# Patient Record
Sex: Female | Born: 1950
Health system: Southern US, Community
[De-identification: ages and names within clinical notes are randomized; demographics above are authoritative.]

## PROBLEM LIST (undated history)

## (undated) DIAGNOSIS — E119 Type 2 diabetes mellitus without complications: Secondary | ICD-10-CM

## (undated) DIAGNOSIS — F4310 Post-traumatic stress disorder, unspecified: Secondary | ICD-10-CM

## (undated) DIAGNOSIS — I10 Essential (primary) hypertension: Secondary | ICD-10-CM

## (undated) DIAGNOSIS — E7800 Pure hypercholesterolemia, unspecified: Secondary | ICD-10-CM

## (undated) HISTORY — PX: APPENDECTOMY: SHX54

## (undated) HISTORY — PX: ADENOIDECTOMY: SUR15

## (undated) HISTORY — PX: BREAST LUMPECTOMY: SHX2

## (undated) HISTORY — PX: TONSILLECTOMY: SUR1361

---

## 2011-01-18 ENCOUNTER — Emergency Department (HOSPITAL_COMMUNITY)
Admission: EM | Admit: 2011-01-18 | Discharge: 2011-01-20 | Disposition: A | Discharge status: Other Acute Inpatient Hospital | Payer: Self-pay | Source: Home / Self Care | Admitting: Emergency Medicine

## 2011-01-20 ENCOUNTER — Inpatient Hospital Stay (HOSPITAL_COMMUNITY)
Admission: AD | Admit: 2011-01-20 | Discharge: 2011-02-15 | DRG: 885 | Disposition: A | Discharge status: Home / Self Care | Payer: 59 | Attending: Psychiatry | Admitting: Psychiatry

## 2011-01-20 DIAGNOSIS — F4310 Post-traumatic stress disorder, unspecified: Secondary | ICD-10-CM

## 2011-01-20 DIAGNOSIS — Z111 Encounter for screening for respiratory tuberculosis: Secondary | ICD-10-CM

## 2011-01-20 DIAGNOSIS — F22 Delusional disorders: Secondary | ICD-10-CM

## 2011-01-20 DIAGNOSIS — I252 Old myocardial infarction: Secondary | ICD-10-CM

## 2011-01-20 DIAGNOSIS — F29 Unspecified psychosis not due to a substance or known physiological condition: Principal | ICD-10-CM

## 2011-01-20 DIAGNOSIS — Z560 Unemployment, unspecified: Secondary | ICD-10-CM

## 2011-01-20 DIAGNOSIS — Z818 Family history of other mental and behavioral disorders: Secondary | ICD-10-CM

## 2011-01-22 LAB — BASIC METABOLIC PANEL
BUN: 10 mg/dL (ref 6–23)
CO2: 24 mEq/L (ref 19–32)
Chloride: 104 mEq/L (ref 96–112)
Creatinine, Ser: 0.88 mg/dL (ref 0.4–1.2)
Glucose, Bld: 142 mg/dL — ABNORMAL HIGH (ref 70–99)

## 2011-01-22 LAB — CBC
MCV: 88 fL (ref 78.0–100.0)
Platelets: 254 10*3/uL (ref 150–400)
RBC: 4.43 MIL/uL (ref 3.87–5.11)
WBC: 8.9 10*3/uL (ref 4.0–10.5)

## 2011-01-22 LAB — RAPID URINE DRUG SCREEN, HOSP PERFORMED
Barbiturates: NOT DETECTED
Benzodiazepines: NOT DETECTED
Cocaine: NOT DETECTED
Opiates: NOT DETECTED

## 2011-01-22 LAB — DIFFERENTIAL
Basophils Absolute: 0 10*3/uL (ref 0.0–0.1)
Eosinophils Absolute: 0 10*3/uL (ref 0.0–0.7)
Lymphocytes Relative: 23 % (ref 12–46)
Lymphs Abs: 2 10*3/uL (ref 0.7–4.0)
Neutrophils Relative %: 69 % (ref 43–77)

## 2011-01-22 LAB — ETHANOL: Alcohol, Ethyl (B): <5 mg/dL (ref 0–10)

## 2011-01-29 NOTE — H&P (Signed)
NAMESHUNDRA, WIRSING NO.:  [id number]  MEDICAL RECORD NO.:  [id number]          PATIENT TYPE:  IPS  LOCATION:  0407                          FACILITY:  [facility]  PHYSICIAN:  Anselm Jungling, MD  DATE OF BIRTH:  April 14, 1951  DATE OF ADMISSION:  01/20/2011 DATE OF DISCHARGE:                      PSYCHIATRIC ADMISSION ASSESSMENT   This is an involuntary admission to the services of Dr. Geralyn Flash. This is a 60 year old divorced white female.  The commitment papers indicate that the patient is known to be mentally ill.  She is refusing to take her meds.  She is seeing people come out of the walls.  She thinks people are trying to kill her.  Apparently she texted her daughter that 5 evil people are in the house trying to kill her.  She states that she is going to fight to the death and/or kill them.  Her daughter called 911 and the police came and the deputy said there was nobody at the house but the respondent.  The respondent was noted to have poor hygiene.  She was not eating or drinking which was very uncommon for her.  The respondent was felt to be a danger to herself and others and hence was brought to [facility] where she had medical clearance.  Today she is quite hyperverbal.  She is also somewhat circumstantial.  However, she does in fact reinforce the fact that there were 5 evil people in the house, that she invoked the name of Jesus to have them disappear and they did.  She also reiterates that she is in charge of an estate in [place], that it has all been turned over to [place], that there no money involved, that her new son-in-law and his family are trying to take it but nothing about this can happen and somehow her father is the last of royalty in [place] and is somehow related to [place] from [place].  She does report that back in 2004 she suffered PTSD.  She was under the care of a Dr. Merita Norton at that time.  She also reports that last  year she had an MI and was hospitalized at [facility] in [place], [name].  She is not currently on any meds and she states that meds generally do not work on her like they do on other people.  SOCIAL HISTORY:  She goes to school a lot.  She has been married once. She has two daughters ages 22 and 24.  She is currently here visiting the younger daughter and her granddaughter.  FAMILY HISTORY:  Apparently her mother was a paranoid schizophrenic.  ALCOHOL AND DRUG HISTORY:  She denies.  MEDICAL CARE PROVIDER:  Is back in [name].  She does not have any here.  She has not been under psychiatric care recently.  She states her provider died a year or two ago.  MEDICAL PROBLEMS:  She reports having had an MI last year.  MEDICATIONS:  None.  DRUG ALLERGIES:  None.  POSITIVE PHYSICAL FINDINGS:  She was medically cleared in the ED at [facility].  She was afebrile.  Her temperature ranged from 97  to 98.5. Her pulse from 61 to 98.  Her respirations from 16 to 20 and her blood pressure from 110/49 to 153/79.  Her UDS was negative.  She had no abnormalities of CBC.  She had no alcohol.  Her potassium was slightly low at 3.3 and her glucose was slightly elevated at 142.  MENTAL STATUS EXAM:  Today she is alert and oriented.  She has taken a shower.  She is appropriately groomed and dressed in her own clothing. She appears to be adequately nourished.  There are notes from the family that indicate a 60 to 80 pound weight loss, however, that is not confirmed at least by the clothing she is wearing and visual inspection. Her speech is hyperverbal.  She can be circumstantial and tangential. Her mood is anxiously depressed.  Her thought processes are somewhat clear, rational and goal oriented.  She is wanting discharge.  She says that this is inappropriate.  She is being held here against her will. Judgment and insight are poor.  Concentration and memory are at least superficially  intact.  Intelligence is at least average.  She denies being suicidal or homicidal.  She denies auditory or visual hallucinations.  However, she does of her own free will tell about the 5 people at the house and how she invoked the name of Jesus to have them leave so she is maintaining her delusions.  DIAGNOSES:  AXIS I:  History for PTSD (post traumatic stress disorder). She is currently delusional. AXIS II:  Deferred. AXIS III:  She reports a myocardial infarction last year. AXIS IV:  Severe, unemployed. AXIS V:  30.  She was empirically ordered Zyprexa 5 mg at h.s. last night.  She states she did not take it.  She is also requesting a different room as she never sleeps near a door and the other bed in her room is empty but she wants to change rooms.  We will try and get her old records and try to come up with some form of plan.  She states that she is ready to go back to [name].     Mickie Leonarda Salon, P.A.-C.   ______________________________ Anselm Jungling, MD    MD/MEDQ  D:  01/21/2011  T:  01/21/2011  Job:  [id number]  Electronically Signed by Jaci Lazier ADAMS P.A.-C. on 01/28/2011 01:59:20 PM Electronically Signed by Geralyn Flash MD on 01/29/2011 08:42:49 AM

## 2011-01-31 DIAGNOSIS — F22 Delusional disorders: Secondary | ICD-10-CM

## 2011-02-22 NOTE — Discharge Summary (Signed)
NAMETAMBI, THOLE NO.:  [id number]  MEDICAL RECORD NO.:  [id number]           PATIENT TYPE:  I  LOCATION:  0407                          FACILITY:  BH  PHYSICIAN:  Anselm Jungling, MD  DATE OF BIRTH:  02-Jan-1951  DATE OF ADMISSION:  01/20/2011 DATE OF DISCHARGE:  02/15/2011                              DISCHARGE SUMMARY   IDENTIFYING DATA/REASON FOR ADMISSION:  This was the first [name] admission for Chelsey Long, a 60 year old divorced Caucasian female who was admitted for treatment of psychotic disorder.  Please refer to the admission note for further details pertaining to the symptoms, circumstances and history that led to her hospitalization.  She was given an initial Axis I diagnosis of psychosis NOS.  MEDICAL AND LABORATORY:  The patient was medically seen and examined by the psychiatric nurse practitioner.  She was in good health without any active or chronic medical problems.  There were no significant medical issues during her stay.  HOSPITAL COURSE:  The patient was admitted to the adult inpatient psychiatric service.  She presented as a well-nourished, normally- developed adult woman of above-average intelligence and education.  She had been brought, under involuntary commitment, at the encouragement of her family.  She had essentially been living out of her car for the previous year.  She had been spending time in the state of [name]. She came to [name], to be closer to her daughter, who was married with several children.  It was the daughter, her son-in-law, who identified Chelsey Long as having significant mental illness in need of immediate treatment.  She presented with florid delusions pertaining to [name], and appeared to be unable to care for herself any further in her itinerant situation of living in her car.  The patient's daughter and son-in-law were willing to take the patient in, except for the fact that she  was so clearly ill and appeared to have no insight into this.  Indeed, Chelsey Long presented with clear speech, and apparently clear sensorium, and was fully oriented.  Her thoughts and speech were normally organized.  She was quite articulate.  She was consistently pleasant, polite, and sweet natured.  She got along well with patients and staff alike throughout her stay.  However, she politely insisted throughout her stay that there was nothing wrong with her, and she specifically denied any psychotic symptoms, voices, delusional ideas or paranoia.  She denied any psychiatric history except for the past history of post-traumatic stress disorder, as she named it.  When asked about her various delusional beliefs, she would discuss them quite openly.  However, in the course of many discussions that the undersigned had with the patient regarding her beliefs about Chelsey Long, the [name], etc., she never, even after taking medication for a couple of weeks, showed any change in her steadfast belief that her delusional ideas were reality, and not a symptom of any mental illness. It was quite striking to the extent that this was the case.  That is, this highly intelligent, educated and articulate woman whose grasp of immediate reality seemed to be quite intact, was capable of having floridly  delusional ideas that were clearly beyond credibility to any rational individual.  This being said, it was noted that Chelsey Long did not appear to develop any new delusions during the time that she was with [place], a stay which spanned nearly 4 weeks.  This may be a reflection of the fact that medication was somewhat effective for her.  The first night on the unit Zyprexa 5 mg as ordered for her, along with p.r.n. Ativan.  However, following this, Zyprexa was discontinued in favor of an ongoing trial of risperidone.  In addition, she had consented to continue on Prozac, which she had been treated with in  the past.  Risperidone was titrated upward to a dose of 4 mg q.h.s., although during the first 2/3 of her hospital stay, she was only willing to take 4 mg q.h.s.  She indicated that she felt fine on this amount of medication and did not feel that any higher dose was necessary.  Indeed, sleep and appetite were quite stable, and she did not appear to exhibit any sedation or any significant movement related side effects from Risperdal whatsoever.  On several occasions, the possibility of switching to risperidone Consta- shot was suggested the patient, but she politely declined this.  As the patient allowed, the treatment team was in contact with the patient's daughter and son-in-law throughout her stay.  Initially she would not allow such contact, but fortunately on the fifth hospital day she did give [place] permission to contact her daughter here in [name]. From that point on, there was regular contact between the case management, the undersigned, the patient's daughter, and son-in-law, Corrie Long and Chelsey Long. Many of these contacts took place over the telephone, but there were 2 face-to-face visits.  The first was a meeting occurred on January 31, the 11th hospital day, in which the family counselor met with the Chelsey Long, and discussed the patient's overall treatment situation and aftercare needs at length.  Following this, on February 9, there was an additional meeting with the Chelsey Long, which included the patient, the undersigned, and our Passenger transport manager.  In that meeting, further discussion was had regarding the patient's diagnosis, symptoms, medication, and aftercare needs, as well as residential needs.  The Chelsey Long, throughout the patient's stay, were extremely available, supportive, and concerned about Chelsey Long's overall situation and ongoing needs.  It was clear that they were highly committed to working with [place] to assure that Chelsey Long's after hospital situation would be  the best possible.  The treatment team and the Chelsey Long were in agreement regarding concerns that Chelsey Long was still demonstrating active symptoms, and delusional thinking, with apparently no insight into her illness whatsoever.  There was concern about whether she would be reliable in terms of continuing her medication regimen following discharge. However, fortunately, she had gave firm reassurances quite consistently that she was quite comfortable with the idea of continuing to take Risperdal and Prozac, and have aftercare outpatient mental health appointments.  The patient's residential needs was discussed at length as well.  It was agreed between the treatment team and the Chelsey Long that it would be in the best interest of all concern for the patient to, at least temporarily, be in a group home or assisted living situation.  Because the Chelsey Long family was in the process of moving to a much smaller residence, there were concerns about there being adequate room for the patient, and concerns that [place] her  isolated for large portions of the day, while other family members were away at school or work.  The Chelsey Long indicated that they were supportive of a plan to find an appropriate facility for Chelsey Long, and they were given information as to various candidate facilities in their general regional area.  The patient was ultimately discharged on February 16 to her family.  At that time, she was in good spirits.  She continued to harbor her delusional beliefs, essentially unchanged from what they had been at time of admission, however, she had formulated no new delusional beliefs that we are aware of.  She was tolerating medication without sedation or any side effects whatsoever.  The patient and her family agreed to the following aftercare plan.  AFTERCARE:  The patient was to [place] [facility] [place] 8:00 a.m..  They were instructed to approach the Social Security office in [place] for an SSI application, and to approach the DSS office in [place] to apply for Medicaid.  DISCHARGE MEDICATIONS: 1. Prozac 20 mg daily. 2. Risperdal 4 mg q.h.s. 3. Trazodone 50 mg h.s. p.r.n. insomnia.  DISCHARGE DIET:  __________  AXIS I: Delusional disorder NOS. AXIS II: Deferred. AXIS III: No acute or chronic illnesses. AXIS IV: Stressors severe. AXIS V: GAF on discharge 50.     Anselm Jungling, MD     SPB/MEDQ  D:  02/21/2011  T:  02/21/2011  Job:  [id number]  Electronically Signed by Geralyn Flash MD on 02/22/2011 12:19:57 PM

## 2013-09-08 DIAGNOSIS — M549 Dorsalgia, unspecified: Secondary | ICD-10-CM | POA: Diagnosis not present

## 2013-09-08 DIAGNOSIS — Z6830 Body mass index (BMI) 30.0-30.9, adult: Secondary | ICD-10-CM | POA: Diagnosis not present

## 2014-02-15 DIAGNOSIS — F29 Unspecified psychosis not due to a substance or known physiological condition: Secondary | ICD-10-CM | POA: Diagnosis not present

## 2014-03-18 DIAGNOSIS — IMO0001 Reserved for inherently not codable concepts without codable children: Secondary | ICD-10-CM | POA: Diagnosis not present

## 2014-03-18 DIAGNOSIS — Z79899 Other long term (current) drug therapy: Secondary | ICD-10-CM | POA: Diagnosis not present

## 2014-06-08 DIAGNOSIS — F29 Unspecified psychosis not due to a substance or known physiological condition: Secondary | ICD-10-CM | POA: Diagnosis not present

## 2014-06-24 DIAGNOSIS — IMO0001 Reserved for inherently not codable concepts without codable children: Secondary | ICD-10-CM | POA: Diagnosis not present

## 2014-09-24 DIAGNOSIS — IMO0001 Reserved for inherently not codable concepts without codable children: Secondary | ICD-10-CM | POA: Diagnosis not present

## 2014-09-24 DIAGNOSIS — I10 Essential (primary) hypertension: Secondary | ICD-10-CM | POA: Diagnosis not present

## 2014-09-28 DIAGNOSIS — F29 Unspecified psychosis not due to a substance or known physiological condition: Secondary | ICD-10-CM | POA: Diagnosis not present

## 2014-12-30 DIAGNOSIS — E1165 Type 2 diabetes mellitus with hyperglycemia: Secondary | ICD-10-CM | POA: Diagnosis not present

## 2014-12-30 DIAGNOSIS — I10 Essential (primary) hypertension: Secondary | ICD-10-CM | POA: Diagnosis not present

## 2015-01-19 DIAGNOSIS — F29 Unspecified psychosis not due to a substance or known physiological condition: Secondary | ICD-10-CM | POA: Diagnosis not present

## 2015-04-01 DIAGNOSIS — I10 Essential (primary) hypertension: Secondary | ICD-10-CM | POA: Diagnosis not present

## 2015-04-01 DIAGNOSIS — E1165 Type 2 diabetes mellitus with hyperglycemia: Secondary | ICD-10-CM | POA: Diagnosis not present

## 2015-04-01 DIAGNOSIS — L309 Dermatitis, unspecified: Secondary | ICD-10-CM | POA: Diagnosis not present

## 2015-05-25 DIAGNOSIS — F29 Unspecified psychosis not due to a substance or known physiological condition: Secondary | ICD-10-CM | POA: Diagnosis not present

## 2015-07-07 DIAGNOSIS — I10 Essential (primary) hypertension: Secondary | ICD-10-CM | POA: Diagnosis not present

## 2015-07-07 DIAGNOSIS — E1165 Type 2 diabetes mellitus with hyperglycemia: Secondary | ICD-10-CM | POA: Diagnosis not present

## 2015-07-07 DIAGNOSIS — Z0000 Encounter for general adult medical examination without abnormal findings: Secondary | ICD-10-CM | POA: Diagnosis not present

## 2015-07-14 DIAGNOSIS — Z1231 Encounter for screening mammogram for malignant neoplasm of breast: Secondary | ICD-10-CM | POA: Diagnosis not present

## 2015-07-25 DIAGNOSIS — H524 Presbyopia: Secondary | ICD-10-CM | POA: Diagnosis not present

## 2015-07-25 DIAGNOSIS — H52223 Regular astigmatism, bilateral: Secondary | ICD-10-CM | POA: Diagnosis not present

## 2015-07-25 DIAGNOSIS — E119 Type 2 diabetes mellitus without complications: Secondary | ICD-10-CM | POA: Diagnosis not present

## 2015-07-25 DIAGNOSIS — H5212 Myopia, left eye: Secondary | ICD-10-CM | POA: Diagnosis not present

## 2015-09-14 DIAGNOSIS — F29 Unspecified psychosis not due to a substance or known physiological condition: Secondary | ICD-10-CM | POA: Diagnosis not present

## 2015-09-27 DIAGNOSIS — R197 Diarrhea, unspecified: Secondary | ICD-10-CM | POA: Diagnosis not present

## 2015-10-07 DIAGNOSIS — E1165 Type 2 diabetes mellitus with hyperglycemia: Secondary | ICD-10-CM | POA: Diagnosis not present

## 2015-10-07 DIAGNOSIS — I10 Essential (primary) hypertension: Secondary | ICD-10-CM | POA: Diagnosis not present

## 2015-10-07 DIAGNOSIS — R194 Change in bowel habit: Secondary | ICD-10-CM | POA: Diagnosis not present

## 2016-01-02 DIAGNOSIS — I10 Essential (primary) hypertension: Secondary | ICD-10-CM | POA: Diagnosis not present

## 2016-01-02 DIAGNOSIS — E1165 Type 2 diabetes mellitus with hyperglycemia: Secondary | ICD-10-CM | POA: Diagnosis not present

## 2016-01-05 DIAGNOSIS — F29 Unspecified psychosis not due to a substance or known physiological condition: Secondary | ICD-10-CM | POA: Diagnosis not present

## 2016-04-03 DIAGNOSIS — E1165 Type 2 diabetes mellitus with hyperglycemia: Secondary | ICD-10-CM | POA: Diagnosis not present

## 2016-04-03 DIAGNOSIS — I10 Essential (primary) hypertension: Secondary | ICD-10-CM | POA: Diagnosis not present

## 2016-04-25 DIAGNOSIS — F29 Unspecified psychosis not due to a substance or known physiological condition: Secondary | ICD-10-CM | POA: Diagnosis not present

## 2016-05-18 ENCOUNTER — Emergency Department (HOSPITAL_COMMUNITY)
Admission: EM | Admit: 2016-05-18 | Discharge: 2016-05-18 | Disposition: A | Discharge status: Home / Self Care | Payer: Medicare Other | Attending: Emergency Medicine | Admitting: Emergency Medicine

## 2016-05-18 ENCOUNTER — Emergency Department (HOSPITAL_COMMUNITY): Payer: Medicare Other

## 2016-05-18 ENCOUNTER — Encounter (HOSPITAL_COMMUNITY): Payer: Self-pay | Admitting: *Deleted

## 2016-05-18 DIAGNOSIS — I10 Essential (primary) hypertension: Secondary | ICD-10-CM | POA: Insufficient documentation

## 2016-05-18 DIAGNOSIS — W010XXA Fall on same level from slipping, tripping and stumbling without subsequent striking against object, initial encounter: Secondary | ICD-10-CM | POA: Insufficient documentation

## 2016-05-18 DIAGNOSIS — S93602A Unspecified sprain of left foot, initial encounter: Secondary | ICD-10-CM | POA: Diagnosis not present

## 2016-05-18 DIAGNOSIS — E119 Type 2 diabetes mellitus without complications: Secondary | ICD-10-CM | POA: Diagnosis not present

## 2016-05-18 DIAGNOSIS — Z7984 Long term (current) use of oral hypoglycemic drugs: Secondary | ICD-10-CM | POA: Diagnosis not present

## 2016-05-18 DIAGNOSIS — M79672 Pain in left foot: Secondary | ICD-10-CM | POA: Diagnosis not present

## 2016-05-18 DIAGNOSIS — S99922A Unspecified injury of left foot, initial encounter: Secondary | ICD-10-CM | POA: Diagnosis not present

## 2016-05-18 DIAGNOSIS — Y939 Activity, unspecified: Secondary | ICD-10-CM | POA: Insufficient documentation

## 2016-05-18 DIAGNOSIS — Y929 Unspecified place or not applicable: Secondary | ICD-10-CM | POA: Insufficient documentation

## 2016-05-18 DIAGNOSIS — Y999 Unspecified external cause status: Secondary | ICD-10-CM | POA: Diagnosis not present

## 2016-05-18 DIAGNOSIS — Z79899 Other long term (current) drug therapy: Secondary | ICD-10-CM | POA: Insufficient documentation

## 2016-05-18 DIAGNOSIS — A0100 Typhoid fever, unspecified: Secondary | ICD-10-CM | POA: Diagnosis not present

## 2016-05-18 HISTORY — DX: Type 2 diabetes mellitus without complications: E11.9

## 2016-05-18 HISTORY — DX: Pure hypercholesterolemia, unspecified: E78.00

## 2016-05-18 HISTORY — DX: Essential (primary) hypertension: I10

## 2016-05-18 HISTORY — DX: Post-traumatic stress disorder, unspecified: F43.10

## 2016-05-18 MED ORDER — NAPROXEN 500 MG PO TABS: 500.0000 mg | ORAL_TABLET | Freq: Two times a day (BID) | ORAL | Status: AC

## 2016-05-18 MED ORDER — TRAMADOL HCL 50 MG PO TABS
50.0000 mg | ORAL_TABLET | Freq: Once | ORAL | Status: AC
  Administered 2016-05-18: 50 mg via ORAL
  Filled 2016-05-18: qty 1

## 2016-05-18 MED ORDER — IBUPROFEN 400 MG PO TABS
400.0000 mg | ORAL_TABLET | Freq: Once | ORAL | Status: AC
  Administered 2016-05-18: 400 mg via ORAL
  Filled 2016-05-18: qty 1

## 2016-05-18 NOTE — Discharge Instructions (Signed)

## 2016-05-18 NOTE — ED Notes (Signed)
Pt c/o left foot pain after fall last night due to slipping. Pt denies LOC.

## 2016-05-21 NOTE — ED Provider Notes (Signed)
CSN: [id number]     Arrival date & time 05/18/16  75 History   First MD Initiated Contact with Patient 05/18/16 1415     Chief Complaint  Patient presents with  . Foot Pain     (Consider location/radiation/quality/duration/timing/severity/associated sxs/prior Treatment) HPI   Chelsey Long is a 65 y.o. female who presents to the Emergency Department complaining of left foot pain for one day.  She states that she slipped and "twisted" her foot.  Pain to her foot with flexing and weight bearing.  Pain improved at rest.  She denies other injuries, numbness or weakness, and open wounds.     Past Medical History  Diagnosis Date  . Diabetes mellitus without complication ([place])   . Hypertension   . High cholesterol   . PTSD (post-traumatic stress disorder)    Past Surgical History  Procedure Laterality Date  . Appendectomy    . Tonsillectomy    . Adenoidectomy    . Breast lumpectomy Right    No family history on file. Social History  Substance Use Topics  . Smoking status: Never Smoker   . Smokeless tobacco: None  . Alcohol Use: Yes     Comment: occasionally   OB History    No data available     Review of Systems  Constitutional: Negative for fever and chills.  Musculoskeletal: Positive for joint swelling and arthralgias (left foot pain).  Skin: Negative for color change and wound.  Neurological: Negative for weakness and numbness.  All other systems reviewed and are negative.     Allergies  Sulfa antibiotics  Home Medications   Prior to Admission medications   Medication Sig Start Date End Date Taking? Authorizing Provider  atorvastatin (LIPITOR) 10 MG tablet Take 10 mg by mouth at bedtime. 04/05/16  Yes Historical Provider, MD  benazepril (LOTENSIN) 5 MG tablet Take 5 mg by mouth at bedtime. 04/05/16  Yes Historical Provider, MD  metFORMIN (GLUCOPHAGE-XR) 500 MG 24 hr tablet Take 500 mg by mouth at bedtime. 04/05/16  Yes Historical Provider, MD  risperidone  (RISPERDAL) 4 MG tablet Take 4 mg by mouth at bedtime. 05/01/16  Yes Historical Provider, MD  naproxen (NAPROSYN) 500 MG tablet Take 1 tablet (500 mg total) by mouth 2 (two) times daily with a meal. 05/18/16   Linken Mcglothen, PA-C   BP 112/52 mmHg  Pulse 81  Temp(Src) 99.1 F (37.3 C) (Oral)  Resp 16  Ht 5\' 7"  (1.702 m)  Wt 81.647 kg  BMI 28.19 kg/m2  SpO2 97% Physical Exam  Constitutional: She is oriented to person, place, and time. She appears well-developed and well-nourished. No distress.  HENT:  Head: Normocephalic and atraumatic.  Cardiovascular: Normal rate, regular rhythm and intact distal pulses.   Pulmonary/Chest: Effort normal and breath sounds normal.  Musculoskeletal: She exhibits edema and tenderness.  ttp of the dorsal left foot.  Mild edema.  DP pulse is brisk,distal sensation intact.  No erythema, abrasion, bruising or bony deformity.  No proximal tenderness.  Neurological: She is alert and oriented to person, place, and time. She exhibits normal muscle tone. Coordination normal.  Skin: Skin is warm and dry.  Nursing note and vitals reviewed.   ED Course  Procedures (including critical care time) Labs Review Labs Reviewed - No data to display  Imaging Review Dg Foot Complete Left  05/18/2016  CLINICAL DATA:  Slip and fall. Pain along the lateral side of the foot. EXAM: LEFT FOOT - COMPLETE 3+ VIEW COMPARISON:  None. FINDINGS:  Negative for a fracture or dislocation. Joint space narrowing at the first MTP joint. Large plantar calcaneal spur. Soft tissues are unremarkable. Alignment of the foot is within normal limits. IMPRESSION: No acute abnormality. Electronically Signed   By: Markus Daft M.D.   On: 05/18/2016 14:12    I have personally reviewed and evaluated these images and lab results as part of my medical decision-making.   EKG Interpretation None      MDM   Final diagnoses:  Foot sprain, left, initial encounter    Pt well appearing.  XR neg for fx.   Remains NV intact.  Likely sprain.  Pt agrees to symptomatic tx and close ortho f/u if not improving      Kem Parkinson, PA-C 05/21/16 [place] Liu, MD 05/21/16 2043

## 2016-07-04 DIAGNOSIS — E1165 Type 2 diabetes mellitus with hyperglycemia: Secondary | ICD-10-CM | POA: Diagnosis not present

## 2016-07-04 DIAGNOSIS — Z1389 Encounter for screening for other disorder: Secondary | ICD-10-CM | POA: Diagnosis not present

## 2016-07-04 DIAGNOSIS — I10 Essential (primary) hypertension: Secondary | ICD-10-CM | POA: Diagnosis not present

## 2016-07-04 DIAGNOSIS — Z0000 Encounter for general adult medical examination without abnormal findings: Secondary | ICD-10-CM | POA: Diagnosis not present

## 2016-07-04 DIAGNOSIS — E784 Other hyperlipidemia: Secondary | ICD-10-CM | POA: Diagnosis not present

## 2016-07-25 DIAGNOSIS — E1165 Type 2 diabetes mellitus with hyperglycemia: Secondary | ICD-10-CM | POA: Diagnosis not present

## 2016-08-14 DIAGNOSIS — F29 Unspecified psychosis not due to a substance or known physiological condition: Secondary | ICD-10-CM | POA: Diagnosis not present

## 2016-08-16 DIAGNOSIS — E7800 Pure hypercholesterolemia, unspecified: Secondary | ICD-10-CM | POA: Diagnosis not present

## 2016-08-16 DIAGNOSIS — I10 Essential (primary) hypertension: Secondary | ICD-10-CM | POA: Diagnosis not present

## 2016-08-16 DIAGNOSIS — Z7984 Long term (current) use of oral hypoglycemic drugs: Secondary | ICD-10-CM | POA: Diagnosis not present

## 2016-08-16 DIAGNOSIS — Z780 Asymptomatic menopausal state: Secondary | ICD-10-CM | POA: Diagnosis not present

## 2016-08-16 DIAGNOSIS — E119 Type 2 diabetes mellitus without complications: Secondary | ICD-10-CM | POA: Diagnosis not present

## 2016-08-16 DIAGNOSIS — M810 Age-related osteoporosis without current pathological fracture: Secondary | ICD-10-CM | POA: Diagnosis not present

## 2016-08-16 DIAGNOSIS — Z79899 Other long term (current) drug therapy: Secondary | ICD-10-CM | POA: Diagnosis not present

## 2016-08-31 DIAGNOSIS — I10 Essential (primary) hypertension: Secondary | ICD-10-CM | POA: Diagnosis not present

## 2016-08-31 DIAGNOSIS — E784 Other hyperlipidemia: Secondary | ICD-10-CM | POA: Diagnosis not present

## 2016-08-31 DIAGNOSIS — E1165 Type 2 diabetes mellitus with hyperglycemia: Secondary | ICD-10-CM | POA: Diagnosis not present

## 2016-10-26 DIAGNOSIS — E1165 Type 2 diabetes mellitus with hyperglycemia: Secondary | ICD-10-CM | POA: Diagnosis not present

## 2016-10-26 DIAGNOSIS — I10 Essential (primary) hypertension: Secondary | ICD-10-CM | POA: Diagnosis not present

## 2016-10-26 DIAGNOSIS — E784 Other hyperlipidemia: Secondary | ICD-10-CM | POA: Diagnosis not present

## 2016-11-18 DIAGNOSIS — Z7984 Long term (current) use of oral hypoglycemic drugs: Secondary | ICD-10-CM | POA: Diagnosis not present

## 2016-11-18 DIAGNOSIS — K228 Other specified diseases of esophagus: Secondary | ICD-10-CM | POA: Diagnosis not present

## 2016-11-18 DIAGNOSIS — R0789 Other chest pain: Secondary | ICD-10-CM | POA: Diagnosis not present

## 2016-11-18 DIAGNOSIS — E119 Type 2 diabetes mellitus without complications: Secondary | ICD-10-CM | POA: Diagnosis not present

## 2016-11-18 DIAGNOSIS — Z79899 Other long term (current) drug therapy: Secondary | ICD-10-CM | POA: Diagnosis not present

## 2016-11-18 DIAGNOSIS — J90 Pleural effusion, not elsewhere classified: Secondary | ICD-10-CM | POA: Diagnosis not present

## 2016-11-18 DIAGNOSIS — R079 Chest pain, unspecified: Secondary | ICD-10-CM | POA: Diagnosis not present

## 2016-11-18 DIAGNOSIS — I10 Essential (primary) hypertension: Secondary | ICD-10-CM | POA: Diagnosis not present

## 2016-11-18 DIAGNOSIS — F4310 Post-traumatic stress disorder, unspecified: Secondary | ICD-10-CM | POA: Diagnosis not present

## 2016-11-18 DIAGNOSIS — Z882 Allergy status to sulfonamides status: Secondary | ICD-10-CM | POA: Diagnosis not present

## 2016-11-18 DIAGNOSIS — Z0389 Encounter for observation for other suspected diseases and conditions ruled out: Secondary | ICD-10-CM | POA: Diagnosis not present

## 2016-11-18 DIAGNOSIS — E785 Hyperlipidemia, unspecified: Secondary | ICD-10-CM | POA: Diagnosis not present

## 2016-11-19 DIAGNOSIS — R079 Chest pain, unspecified: Secondary | ICD-10-CM | POA: Diagnosis not present

## 2016-11-19 DIAGNOSIS — I10 Essential (primary) hypertension: Secondary | ICD-10-CM | POA: Diagnosis not present

## 2016-11-19 DIAGNOSIS — E119 Type 2 diabetes mellitus without complications: Secondary | ICD-10-CM | POA: Diagnosis not present

## 2016-11-19 DIAGNOSIS — E785 Hyperlipidemia, unspecified: Secondary | ICD-10-CM | POA: Diagnosis not present

## 2016-11-19 DIAGNOSIS — R0789 Other chest pain: Secondary | ICD-10-CM | POA: Diagnosis not present

## 2016-11-20 DIAGNOSIS — I10 Essential (primary) hypertension: Secondary | ICD-10-CM | POA: Diagnosis not present

## 2016-11-20 DIAGNOSIS — E1165 Type 2 diabetes mellitus with hyperglycemia: Secondary | ICD-10-CM | POA: Diagnosis not present

## 2016-11-20 DIAGNOSIS — E784 Other hyperlipidemia: Secondary | ICD-10-CM | POA: Diagnosis not present

## 2016-11-20 DIAGNOSIS — R079 Chest pain, unspecified: Secondary | ICD-10-CM | POA: Diagnosis not present

## 2016-12-03 DIAGNOSIS — I10 Essential (primary) hypertension: Secondary | ICD-10-CM | POA: Diagnosis not present

## 2016-12-03 DIAGNOSIS — R938 Abnormal findings on diagnostic imaging of other specified body structures: Secondary | ICD-10-CM | POA: Diagnosis not present

## 2016-12-03 DIAGNOSIS — E118 Type 2 diabetes mellitus with unspecified complications: Secondary | ICD-10-CM | POA: Diagnosis not present

## 2016-12-03 DIAGNOSIS — E785 Hyperlipidemia, unspecified: Secondary | ICD-10-CM | POA: Diagnosis not present

## 2016-12-03 DIAGNOSIS — F4310 Post-traumatic stress disorder, unspecified: Secondary | ICD-10-CM | POA: Diagnosis not present

## 2016-12-03 DIAGNOSIS — Z1211 Encounter for screening for malignant neoplasm of colon: Secondary | ICD-10-CM | POA: Diagnosis not present

## 2016-12-03 DIAGNOSIS — Z0000 Encounter for general adult medical examination without abnormal findings: Secondary | ICD-10-CM | POA: Diagnosis not present

## 2017-01-07 DIAGNOSIS — E118 Type 2 diabetes mellitus with unspecified complications: Secondary | ICD-10-CM | POA: Diagnosis not present

## 2017-01-07 DIAGNOSIS — E119 Type 2 diabetes mellitus without complications: Secondary | ICD-10-CM | POA: Diagnosis not present

## 2017-01-08 DIAGNOSIS — F4310 Post-traumatic stress disorder, unspecified: Secondary | ICD-10-CM | POA: Diagnosis not present

## 2017-02-15 DIAGNOSIS — Z01411 Encounter for gynecological examination (general) (routine) with abnormal findings: Secondary | ICD-10-CM | POA: Diagnosis not present

## 2017-02-15 DIAGNOSIS — Z1231 Encounter for screening mammogram for malignant neoplasm of breast: Secondary | ICD-10-CM | POA: Diagnosis not present

## 2017-02-15 DIAGNOSIS — N952 Postmenopausal atrophic vaginitis: Secondary | ICD-10-CM | POA: Diagnosis not present

## 2017-02-15 DIAGNOSIS — N761 Subacute and chronic vaginitis: Secondary | ICD-10-CM | POA: Diagnosis not present

## 2017-02-15 DIAGNOSIS — Z124 Encounter for screening for malignant neoplasm of cervix: Secondary | ICD-10-CM | POA: Diagnosis not present

## 2017-03-06 DIAGNOSIS — Z23 Encounter for immunization: Secondary | ICD-10-CM | POA: Diagnosis not present

## 2017-03-06 DIAGNOSIS — Z0000 Encounter for general adult medical examination without abnormal findings: Secondary | ICD-10-CM | POA: Diagnosis not present

## 2017-03-06 DIAGNOSIS — I10 Essential (primary) hypertension: Secondary | ICD-10-CM | POA: Diagnosis not present

## 2017-03-06 DIAGNOSIS — R938 Abnormal findings on diagnostic imaging of other specified body structures: Secondary | ICD-10-CM | POA: Diagnosis not present

## 2017-03-06 DIAGNOSIS — E785 Hyperlipidemia, unspecified: Secondary | ICD-10-CM | POA: Diagnosis not present

## 2017-03-06 DIAGNOSIS — F4310 Post-traumatic stress disorder, unspecified: Secondary | ICD-10-CM | POA: Diagnosis not present

## 2017-03-06 DIAGNOSIS — E119 Type 2 diabetes mellitus without complications: Secondary | ICD-10-CM | POA: Diagnosis not present

## 2017-04-10 DIAGNOSIS — K210 Gastro-esophageal reflux disease with esophagitis: Secondary | ICD-10-CM | POA: Diagnosis not present

## 2017-04-10 DIAGNOSIS — D125 Benign neoplasm of sigmoid colon: Secondary | ICD-10-CM | POA: Diagnosis not present

## 2017-04-10 DIAGNOSIS — K648 Other hemorrhoids: Secondary | ICD-10-CM | POA: Diagnosis not present

## 2017-04-10 DIAGNOSIS — Z1211 Encounter for screening for malignant neoplasm of colon: Secondary | ICD-10-CM | POA: Diagnosis not present

## 2017-04-10 DIAGNOSIS — Z79899 Other long term (current) drug therapy: Secondary | ICD-10-CM | POA: Diagnosis not present

## 2017-04-10 DIAGNOSIS — K5730 Diverticulosis of large intestine without perforation or abscess without bleeding: Secondary | ICD-10-CM | POA: Diagnosis not present

## 2017-04-10 DIAGNOSIS — K449 Diaphragmatic hernia without obstruction or gangrene: Secondary | ICD-10-CM | POA: Diagnosis not present

## 2017-04-29 DIAGNOSIS — F4310 Post-traumatic stress disorder, unspecified: Secondary | ICD-10-CM | POA: Diagnosis not present

## 2017-04-29 DIAGNOSIS — F209 Schizophrenia, unspecified: Secondary | ICD-10-CM | POA: Diagnosis not present

## 2017-12-06 IMAGING — DX DG FOOT COMPLETE 3+V*L*
3 series · 3 of 3 positions shown · non-contrast
Comparison: None.

CLINICAL DATA: Slip and fall. Pain along the lateral side of the
foot.

EXAM:
LEFT FOOT - COMPLETE 3+ VIEW

[foot ap]
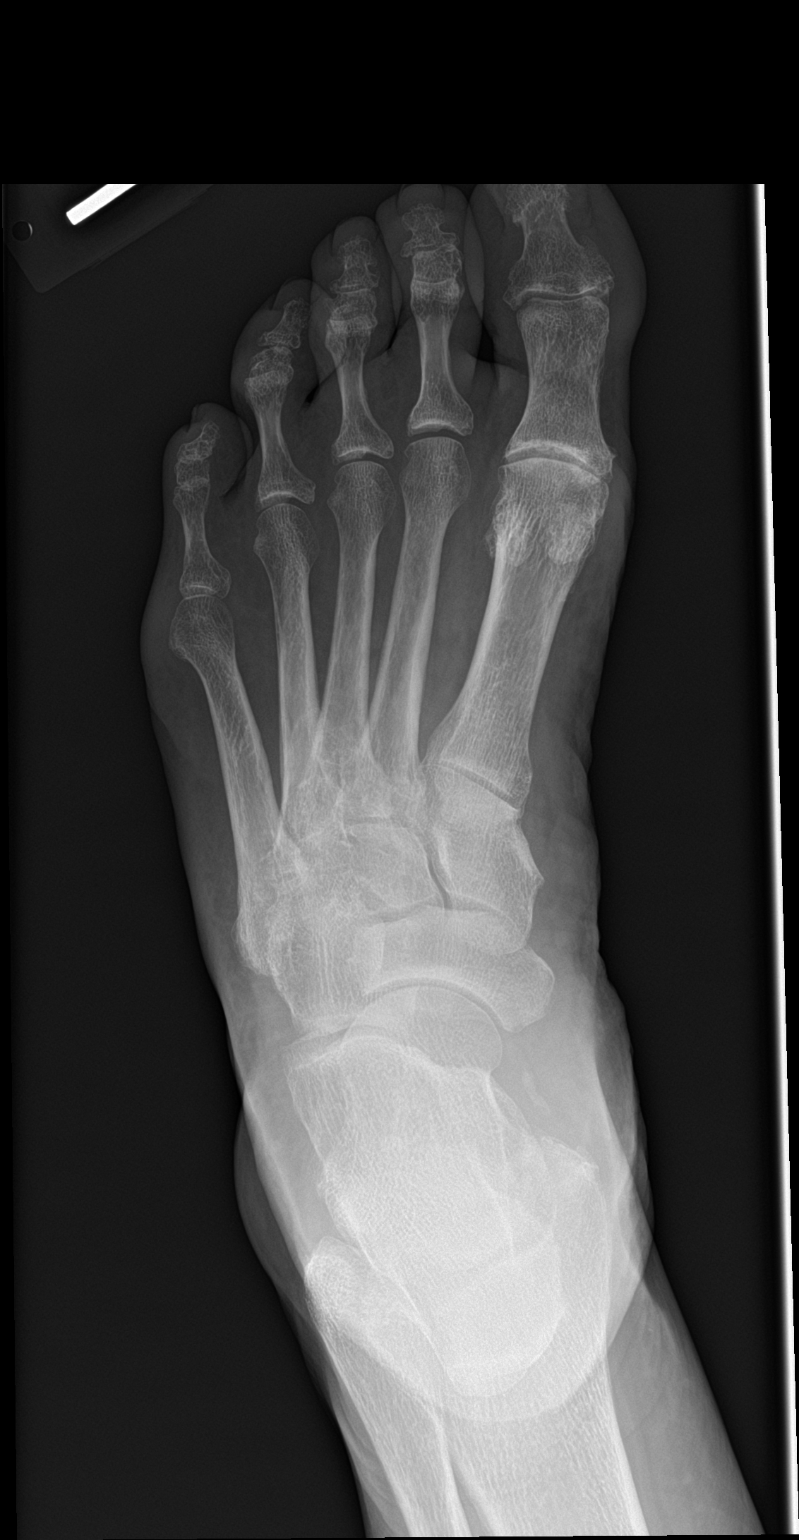

[foot obl]
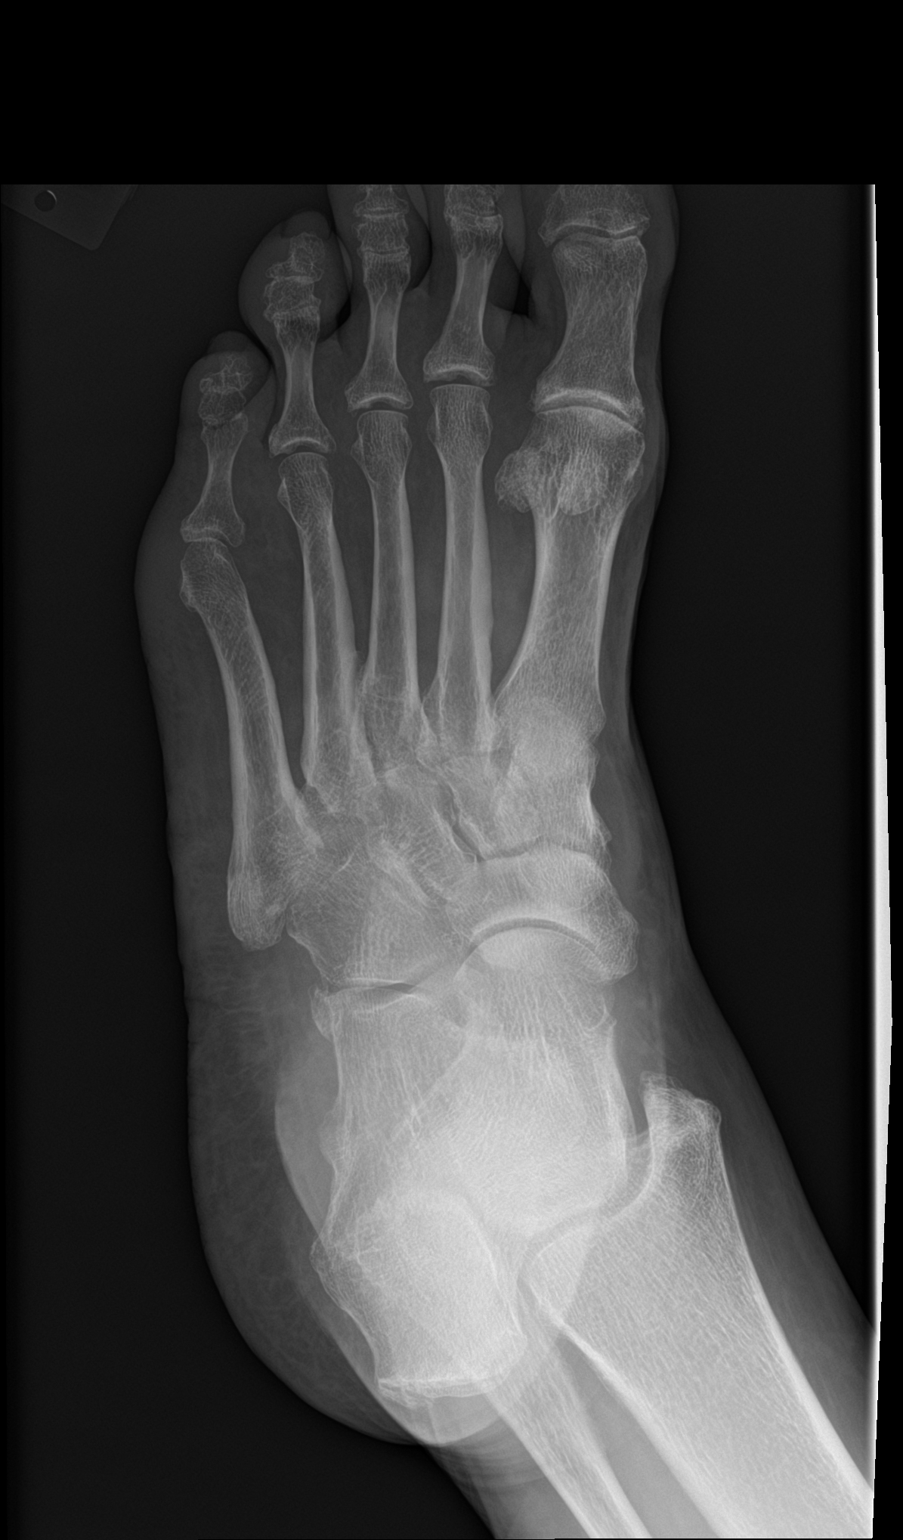

[foot lat]
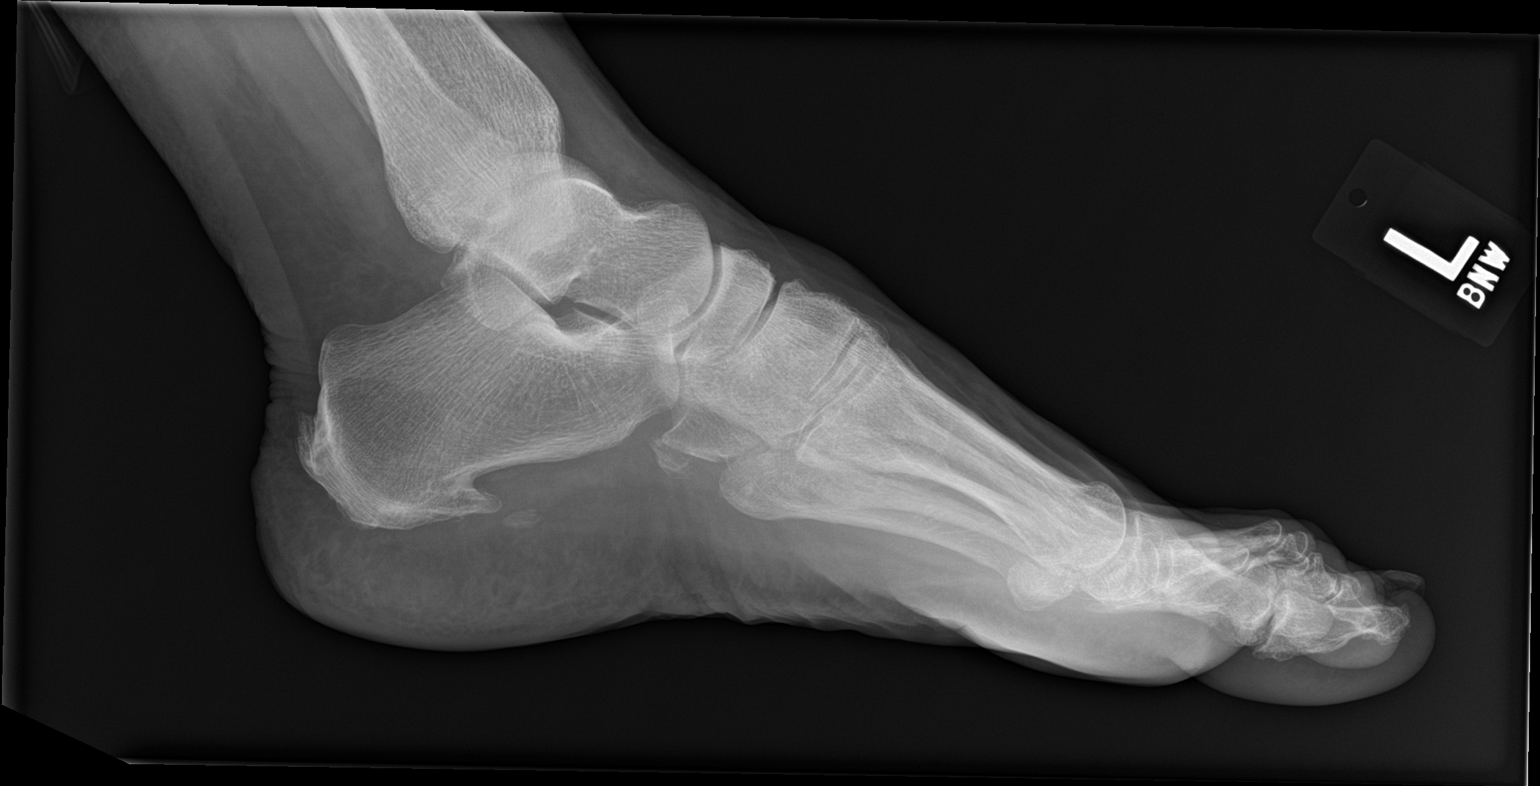

[3 of 3 positions shown; findings below may reference images not displayed]

FINDINGS: Negative for a fracture or dislocation. Joint space narrowing at the
first MTP joint. Large plantar calcaneal spur. Soft tissues are
unremarkable. Alignment of the foot is within normal limits.
IMPRESSION: No acute abnormality.
